# Patient Record
Sex: Male | Born: 1970 | Race: White | Hispanic: No | Marital: Married | Smoking: Never smoker
Health system: Southern US, Community
[De-identification: ages and names within clinical notes are randomized; demographics above are authoritative.]

## PROBLEM LIST (undated history)

## (undated) ENCOUNTER — Emergency Department (HOSPITAL_COMMUNITY): Admission: EM | Payer: BC Managed Care – PPO | Source: Home / Self Care

## (undated) DIAGNOSIS — E669 Obesity, unspecified: Secondary | ICD-10-CM

## (undated) DIAGNOSIS — E785 Hyperlipidemia, unspecified: Secondary | ICD-10-CM

## (undated) HISTORY — DX: Obesity, unspecified: E66.9

## (undated) HISTORY — PX: OTHER SURGICAL HISTORY: SHX169

## (undated) HISTORY — DX: Hyperlipidemia, unspecified: E78.5

---

## 2003-02-11 ENCOUNTER — Encounter: Payer: Self-pay | Admitting: Emergency Medicine

## 2003-02-11 ENCOUNTER — Emergency Department (HOSPITAL_COMMUNITY): Admission: EM | Admit: 2003-02-11 | Discharge: 2003-02-12 | Payer: Self-pay | Admitting: Emergency Medicine

## 2007-02-01 ENCOUNTER — Emergency Department (HOSPITAL_COMMUNITY): Admission: EM | Admit: 2007-02-01 | Discharge: 2007-02-01 | Payer: Self-pay | Admitting: Emergency Medicine

## 2008-08-18 ENCOUNTER — Emergency Department (HOSPITAL_COMMUNITY): Admission: EM | Admit: 2008-08-18 | Discharge: 2008-08-18 | Payer: Self-pay | Admitting: Emergency Medicine

## 2009-10-30 ENCOUNTER — Encounter (INDEPENDENT_AMBULATORY_CARE_PROVIDER_SITE_OTHER): Payer: Self-pay | Admitting: *Deleted

## 2010-08-14 NOTE — Letter (Signed)
Summary: Unable to Reach, Consult Scheduled  [facility]  [address]   [place], [place] [address]   Phone: [phone]  Fax: [phone]    10/30/2009  GARLAN DREWES [address] [place], [place]  [address] 1970-11-01   Dear Mr. Forton,   We have been unable to reach you by phone.  Please contact our office with an updated phone number.  At the recommendation of DR HAWKINS  we have been asked to schedule you a consult with DR FIELDS for your abdomen pain.  Please call our office at [phone].     Thank you,    Diana Eves  [facility] R. Roetta Sessions, M.D.    Jonette Eva, M.D. Lorenza Burton, FNP-BC    Tana Coast, PA-C Phone: [phone]    Fax: [phone]

## 2010-10-30 LAB — BASIC METABOLIC PANEL
Calcium: 9.3 mg/dL (ref 8.4–10.5)
GFR calc Af Amer: >60 mL/min (ref >60)
GFR calc non Af Amer: >60 mL/min (ref >60)
Glucose, Bld: 151 mg/dL — ABNORMAL HIGH (ref 70–99)
Potassium: 3.7 mEq/L (ref 3.5–5.1)
Sodium: 135 mEq/L (ref 135–145)

## 2010-10-30 LAB — DIFFERENTIAL
Eosinophils Relative: 0 % (ref 0–5)
Lymphocytes Relative: 7 % — ABNORMAL LOW (ref 12–46)
Lymphs Abs: 0.8 10*3/uL (ref 0.7–4.0)
Monocytes Absolute: 0.8 10*3/uL (ref 0.1–1.0)
Monocytes Relative: 7 % (ref 3–12)
Neutro Abs: 10.3 10*3/uL — ABNORMAL HIGH (ref 1.7–7.7)

## 2010-10-30 LAB — CBC
HCT: 43.6 % (ref 39.0–52.0)
Hemoglobin: 14.8 g/dL (ref 13.0–17.0)
RBC: 4.77 MIL/uL (ref 4.22–5.81)
RDW: 13.3 % (ref 11.5–15.5)

## 2011-02-22 ENCOUNTER — Ambulatory Visit (INDEPENDENT_AMBULATORY_CARE_PROVIDER_SITE_OTHER): Payer: 59 | Admitting: Urology

## 2011-02-22 DIAGNOSIS — Z3009 Encounter for other general counseling and advice on contraception: Secondary | ICD-10-CM

## 2011-03-08 ENCOUNTER — Encounter (INDEPENDENT_AMBULATORY_CARE_PROVIDER_SITE_OTHER): Payer: 59 | Admitting: Urology

## 2011-03-08 DIAGNOSIS — Z302 Encounter for sterilization: Secondary | ICD-10-CM

## 2012-10-05 ENCOUNTER — Encounter: Payer: Self-pay | Admitting: Gastroenterology

## 2012-11-03 ENCOUNTER — Ambulatory Visit (INDEPENDENT_AMBULATORY_CARE_PROVIDER_SITE_OTHER): Payer: BC Managed Care – PPO | Admitting: Gastroenterology

## 2012-11-03 ENCOUNTER — Encounter: Payer: Self-pay | Admitting: Gastroenterology

## 2012-11-03 DIAGNOSIS — K625 Hemorrhage of anus and rectum: Secondary | ICD-10-CM

## 2012-11-03 DIAGNOSIS — R1013 Epigastric pain: Secondary | ICD-10-CM

## 2012-11-03 DIAGNOSIS — G8929 Other chronic pain: Secondary | ICD-10-CM

## 2012-11-03 MED ORDER — MOVIPREP 100 G PO SOLR: 1.0000 | Freq: Once | ORAL | Status: DC

## 2012-11-03 NOTE — Patient Instructions (Addendum)
Losing weight (your BMI is 44 which is in morbidly obese category) may help some of your GI symptoms as well as your overall health.  Consider bariatric program here in [place].  Your obesity is your number one long term health concern. You will be set up for a colonoscopy for minor rectal bleeding, You will be set up for an upper endoscopy for epigastric pain (LEC, moderate sedation). Please start taking citrucel (orange flavored) powder fiber supplement.  This may cause some bloating at first but that usually goes away. Begin with a small spoonful and work your way up to a large, heaping spoonful daily over a week.                                               We are excited to introduce MyChart, a new best-in-class service that provides you online access to important information in your electronic medical record. We want to make it easier for you to view your health information - all in one secure location - when and where you need it. We expect MyChart will enhance the quality of care and service we provide.  When you register for MyChart, you can:    View your test results.    Request appointments and receive appointment reminders via email.    Request medication renewals.    View your medical history, allergies, medications and immunizations.    Communicate with your physician's office through a password-protected site.    Conveniently print information such as your medication lists.  To find out if MyChart is right for you, please talk to a member of our clinical staff today. We will gladly answer your questions about this free health and wellness tool.  If you are age 40 or older and want a member of your family to have access to your record, you must provide written consent by completing a proxy form available at our office. Please speak to our clinical staff about guidelines regarding accounts for patients younger than age 40.  As you activate your MyChart account and need any  technical assistance, please call the MyChart technical support line at [phone]) or email your question to [email or web address]. If you email your question(s), please include your name, a return phone number and the best time to reach you.  If you have non-urgent health-related questions, you can send a message to our office through MyChart at [email or web address]. If you have a medical emergency, call 911.  Thank you for using MyChart as your new health and wellness resource!   MyChart licensed from [employer],  [id number]. Patents Pending.

## 2012-11-03 NOTE — Progress Notes (Signed)
HPI: This is a  very pleasant 42 year old man who is here with his wife today.   Previously a Emergency planning/management officer.  2 years ago was having stabbing pain.  Was put on some type of med for about 2 months (may have been PPI).  He was eating fine, no abd pains but side effect of constipation.  Empty stomach causes epigastric pain, constant dull pain.  Eating will improve (anything except dairy, ginger ale, bananas, ketchup.  But [employer] and [employer] is OK).  The pain is a bit positional as well.    Overall weight up 20-30 pounds in 1 year  Tends to be a bit constipation.  Very rare overt rectal bleeding, but + especially with spicy foods.  No NSAIDs.  No abd imaging.  Currently on no stomach meds.  Review of systems: Pertinent positive and negative review of systems were noted in the above HPI section. Complete review of systems was performed and was otherwise normal.    Past Medical History  Diagnosis Date  . Hyperlipidemia   . Obesity     Past Surgical History  Procedure Laterality Date  . Arm surgery Right     No current outpatient prescriptions on file.   No current facility-administered medications for this visit.    Allergies as of 11/03/2012 - Review Complete 11/03/2012  Allergen Reaction Noted  . Penicillins  11/03/2012    Family History  Problem Relation Age of Onset  . Diabetes Mother   . Heart disease Father     History   Social History  . Marital Status: Married    Spouse Name: N/A    Number of Children: N/A  . Years of Education: N/A   Occupational History  . Not on file.   Social History Main Topics  . Smoking status: Never Smoker   . Smokeless tobacco: Never Used  . Alcohol Use: Yes  . Drug Use: No  . Sexually Active: Not on file   Other Topics Concern  . Not on file   Social History Narrative  . No narrative on file       Physical Exam: BP 130/80  Pulse 72  Ht 6' (1.829 m)  Wt 323 lb (146.512 kg)  BMI 43.8  kg/m2 Constitutional: generally well-appearing Psychiatric: alert and oriented x3 Eyes: extraocular movements intact Mouth: oral pharynx moist, no lesions Neck: supple no lymphadenopathy Cardiovascular: heart regular rate and rhythm Lungs: clear to auscultation bilaterally Abdomen: soft, nontender, nondistended, no obvious ascites, no peritoneal signs, normal bowel sounds Extremities: no lower extremity edema bilaterally Skin: no lesions on visible extremities    Assessment and plan: 42 y.o. male with  intermittent abdominal pains, chronic constipation, intermittent rectal bleeding, morbid obesity  We will proceed with EGD and colonoscopy for rectal bleeding and epigastric discomfort. I see no reason for any further blood tests or imaging studies prior to that. We actually spent most of her time talking about his morbid obesity, BMI 44. His wife had artery pulled some literature on the bariatric program here in [place]. He has struggled with his weight his entire life, recently putting on 30 pounds in the past year even. He understands this is his #1 overall health problem.

## 2012-12-01 ENCOUNTER — Ambulatory Visit (AMBULATORY_SURGERY_CENTER): Payer: BC Managed Care – PPO | Admitting: Gastroenterology

## 2012-12-01 ENCOUNTER — Encounter: Payer: Self-pay | Admitting: Gastroenterology

## 2012-12-01 VITALS — BP 143/76 | HR 92 | Temp 98.2°F | Resp 18 | Ht 72.0 in | Wt 323.0 lb

## 2012-12-01 DIAGNOSIS — K2990 Gastroduodenitis, unspecified, without bleeding: Secondary | ICD-10-CM

## 2012-12-01 DIAGNOSIS — G8929 Other chronic pain: Secondary | ICD-10-CM

## 2012-12-01 DIAGNOSIS — R1013 Epigastric pain: Secondary | ICD-10-CM

## 2012-12-01 DIAGNOSIS — K2960 Other gastritis without bleeding: Secondary | ICD-10-CM

## 2012-12-01 DIAGNOSIS — A048 Other specified bacterial intestinal infections: Secondary | ICD-10-CM

## 2012-12-01 DIAGNOSIS — K625 Hemorrhage of anus and rectum: Secondary | ICD-10-CM

## 2012-12-01 MED ORDER — SODIUM CHLORIDE 0.9 % IV SOLN: 500.0000 mL | INTRAVENOUS | Status: DC

## 2012-12-01 NOTE — Patient Instructions (Addendum)
Discharge instructions given with verbal understanding. Biopsies taken. Resume previous medications. YOU HAD AN ENDOSCOPIC PROCEDURE TODAY AT THE [facility]: Refer to the procedure report that was given to you for any specific questions about what was found during the examination.  If the procedure report does not answer your questions, please call your gastroenterologist to clarify.  If you requested that your care partner not be given the details of your procedure findings, then the procedure report has been included in a sealed envelope for you to review at your convenience later.  YOU SHOULD EXPECT: Some feelings of bloating in the abdomen. Passage of more gas than usual.  Walking can help get rid of the air that was put into your GI tract during the procedure and reduce the bloating. If you had a lower endoscopy (such as a colonoscopy or flexible sigmoidoscopy) you may notice spotting of blood in your stool or on the toilet paper. If you underwent a bowel prep for your procedure, then you may not have a normal bowel movement for a few days.  DIET: Your first meal following the procedure should be a light meal and then it is ok to progress to your normal diet.  A half-sandwich or bowl of soup is an example of a good first meal.  Heavy or fried foods are harder to digest and may make you feel nauseous or bloated.  Likewise meals heavy in dairy and vegetables can cause extra gas to form and this can also increase the bloating.  Drink plenty of fluids but you should avoid alcoholic beverages for 24 hours.  ACTIVITY: Your care partner should take you home directly after the procedure.  You should plan to take it easy, moving slowly for the rest of the day.  You can resume normal activity the day after the procedure however you should NOT DRIVE or use heavy machinery for 24 hours (because of the sedation medicines used during the test).    SYMPTOMS TO REPORT IMMEDIATELY: A gastroenterologist  can be reached at any hour.  During normal business hours, 8:30 AM to 5:00 PM Monday through Friday, call [phone].  After hours and on weekends, please call the GI answering service at [phone] who will take a message and have the physician on call contact you.   Following lower endoscopy (colonoscopy or flexible sigmoidoscopy):  Excessive amounts of blood in the stool  Significant tenderness or worsening of abdominal pains  Swelling of the abdomen that is new, acute  Fever of 100F or higher  Following upper endoscopy (EGD)  Vomiting of blood or coffee ground material  New chest pain or pain under the shoulder blades  Painful or persistently difficult swallowing  New shortness of breath  Fever of 100F or higher  Black, tarry-looking stools  FOLLOW UP: If any biopsies were taken you will be contacted by phone or by letter within the next 1-3 weeks.  Call your gastroenterologist if you have not heard about the biopsies in 3 weeks.  Our staff will call the home number listed on your records the next business day following your procedure to check on you and address any questions or concerns that you may have at that time regarding the information given to you following your procedure. This is a courtesy call and so if there is no answer at the home number and we have not heard from you through the emergency physician on call, we will assume that you have returned to your   regular daily activities without incident.  SIGNATURES/CONFIDENTIALITY: You and/or your care partner have signed paperwork which will be entered into your electronic medical record.  These signatures attest to the fact that that the information above on your After Visit Summary has been reviewed and is understood.  Full responsibility of the confidentiality of this discharge information lies with you and/or your care-partner. 

## 2012-12-01 NOTE — Op Note (Signed)
[facility] [address] [place], [address]   ENDOSCOPY PROCEDURE REPORT  PATIENT: Aaron Ford, Aaron Ford  MR#: [id number] BIRTHDATE: 10-28-1970 , 42  yrs. old GENDER: Male ENDOSCOPIST: Rachael Fee, MD PROCEDURE DATE:  12/01/2012 PROCEDURE:  EGD w/ biopsy ASA CLASS:     Class III INDICATIONS:  epigastric discomfort, better with eating. MEDICATIONS: There was residual sedation effect present from prior procedure, Fentanyl 25 mcg IV, Versed 1mg  IV, and These medications were titrated to patient response per physician's verbal order TOPICAL ANESTHETIC: Cetacaine Spray  DESCRIPTION OF PROCEDURE: After the risks benefits and alternatives of the procedure were thoroughly explained, informed consent was obtained.  The Pentax Gastroscope [id number] endoscope was introduced through the mouth and advanced to the second portion of the duodenum without limitations.  The instrument was slowly withdrawn as the mucosa was fully examined.   There was moderate pangastritis.  This was biopsied and sent to pathology.  The examination was otherwise normal.  Retroflexed views revealed no abnormalities.     The scope was then withdrawn from the patient and the procedure completed.  COMPLICATIONS: There were no complications.  ENDOSCOPIC IMPRESSION: There was moderate pangastritis.  This was biopsied and sent to pathology. The examination was otherwise normal.  RECOMMENDATIONS: If biopsies show H.  pylori you will be started on appropriate antibiotics.    eSigned:  Rachael Fee, MD 12/01/2012 3:17 PM   CC: Kari Baars, MD

## 2012-12-01 NOTE — Progress Notes (Signed)
Patient did not experience any of the following events: a burn prior to discharge; a fall within the facility; wrong site/side/patient/procedure/implant event; or a hospital transfer or hospital admission upon discharge from the facility. (G8907) Patient did not have preoperative order for IV antibiotic SSI prophylaxis. (G8918)  

## 2012-12-01 NOTE — Op Note (Signed)
[facility] [address] [place], [address]   COLONOSCOPY PROCEDURE REPORT  PATIENT: Aaron Ford, Aaron Ford  MR#: [id number] BIRTHDATE: [birth year] , 42  yrs. old GENDER: Male ENDOSCOPIST: Rachael Fee, MD REFERRED QI:ONGEXB Hawkins, M.D. PROCEDURE DATE:  12/01/2012 PROCEDURE:   Colonoscopy, diagnostic  (incomplete examination) ASA CLASS:   Class III INDICATIONS:minor rectal bleeding. MEDICATIONS: Fentanyl 75 mcg IV, Versed 9 mg IV, and These medications were titrated to patient response per physician's verbal order  DESCRIPTION OF PROCEDURE:   After the risks benefits and alternatives of the procedure were thoroughly explained, informed consent was obtained.  A digital rectal exam revealed no abnormalities of the rectum.   The [id number]  endoscope was introduced through the anus and advanced to the ascending colon. No adverse events experienced.   Limited by extreme patient discomfort and his morbid obesity.   The quality of the prep was good.  The instrument was then slowly withdrawn as the colon was fully examined.    COLON FINDINGS: The colon was normal to the ascending segment.  I could see the distal aspect of the IC valve but not into the base of the cecum.  Retroflexed views revealed no abnormalities. The time to cecum=minutes 0 seconds.  Withdrawal time=minutes 0 seconds.  The scope was withdrawn and the procedure completed. COMPLICATIONS: There were no complications.  ENDOSCOPIC IMPRESSION: The colon was normal to the ascending segment.  I could see the distal aspect of the IC valve but not into the base of the cecum. Your recent minor rectal bleeding was likely from resolved hemorrhoids.  RECOMMENDATIONS: Continue once daily fiber supplement.   eSigned:  Rachael Fee, MD 12/01/2012 3:08 PM

## 2012-12-02 ENCOUNTER — Telehealth: Payer: Self-pay

## 2012-12-02 NOTE — Telephone Encounter (Signed)
Left message on answering machine. 

## 2012-12-14 ENCOUNTER — Telehealth: Payer: Self-pay

## 2012-12-14 NOTE — Telephone Encounter (Signed)
Please call the patient. Biopsies from stomach are positive for H. Pylori. Please call in amoxicillin 1 gram po twice daily for 14 days, clarithromycin 500 mg po twice daily for 14 days (no refills). He also needs to be on twice daily OTC omeprazole for 1 months, needs rov with me in 6 weeks to discuss his response to treatment.   Pt is allergic to amoxicillin what else can I send for H pylori?

## 2012-12-15 MED ORDER — BIS SUBCIT-METRONID-TETRACYC 140-125-125 MG PO CAPS: 3.0000 | ORAL_CAPSULE | Freq: Three times a day (TID) | ORAL | Status: DC

## 2012-12-15 NOTE — Telephone Encounter (Signed)
pylera has been sent to the pharmacy, pt will cal back to schedule ROV

## 2012-12-15 NOTE — Telephone Encounter (Signed)
Thanks,  Please call him in course of pylera.

## 2013-05-24 ENCOUNTER — Emergency Department (HOSPITAL_COMMUNITY)
Admission: EM | Admit: 2013-05-24 | Discharge: 2013-05-24 | Disposition: A | Discharge status: Home / Self Care | Payer: BC Managed Care – PPO | Attending: Emergency Medicine | Admitting: Emergency Medicine

## 2013-05-24 ENCOUNTER — Encounter (HOSPITAL_COMMUNITY): Payer: Self-pay | Admitting: Emergency Medicine

## 2013-05-24 ENCOUNTER — Emergency Department (HOSPITAL_COMMUNITY): Payer: BC Managed Care – PPO

## 2013-05-24 DIAGNOSIS — Z880 Allergy status to penicillin: Secondary | ICD-10-CM | POA: Insufficient documentation

## 2013-05-24 DIAGNOSIS — Z79899 Other long term (current) drug therapy: Secondary | ICD-10-CM | POA: Insufficient documentation

## 2013-05-24 DIAGNOSIS — L259 Unspecified contact dermatitis, unspecified cause: Secondary | ICD-10-CM | POA: Insufficient documentation

## 2013-05-24 DIAGNOSIS — E669 Obesity, unspecified: Secondary | ICD-10-CM | POA: Insufficient documentation

## 2013-05-24 DIAGNOSIS — J3489 Other specified disorders of nose and nasal sinuses: Secondary | ICD-10-CM | POA: Insufficient documentation

## 2013-05-24 DIAGNOSIS — L309 Dermatitis, unspecified: Secondary | ICD-10-CM

## 2013-05-24 DIAGNOSIS — R05 Cough: Secondary | ICD-10-CM | POA: Insufficient documentation

## 2013-05-24 DIAGNOSIS — IMO0002 Reserved for concepts with insufficient information to code with codable children: Secondary | ICD-10-CM | POA: Insufficient documentation

## 2013-05-24 DIAGNOSIS — R059 Cough, unspecified: Secondary | ICD-10-CM | POA: Insufficient documentation

## 2013-05-24 MED ORDER — HYDROCOD POLST-CHLORPHEN POLST 10-8 MG/5ML PO LQCR: 5.0000 mL | Freq: Two times a day (BID) | ORAL | Status: DC | PRN

## 2013-05-24 MED ORDER — AZITHROMYCIN 250 MG PO TABS: ORAL_TABLET | ORAL | Status: DC

## 2013-05-24 MED ORDER — DEXAMETHASONE SODIUM PHOSPHATE 4 MG/ML IJ SOLN
10.0000 mg | Freq: Once | INTRAMUSCULAR | Status: AC
  Administered 2013-05-24: 10 mg via INTRAMUSCULAR
  Filled 2013-05-24: qty 3

## 2013-05-24 MED ORDER — TRIAMCINOLONE ACETONIDE 0.1 % EX CREA: 1.0000 "application " | TOPICAL_CREAM | Freq: Three times a day (TID) | CUTANEOUS | Status: DC

## 2013-05-24 NOTE — Discharge Instructions (Signed)
Fever, Adult °A fever is a temperature of 100.4° F (38° C) or above.  °HOME CARE °· Take fever medicine as told by your doctor. Do not  take aspirin for fever if you are younger than 40 years of age. °· If you are given antibiotic medicine, take it as told. Finish the medicine even if you start to feel better. °· Rest. °· Drink enough fluids to keep your pee (urine) clear or pale yellow. Do not drink alcohol. °· Take a bath or shower with room temperature water. Do not use ice water or alcohol sponge baths. °· Wear lightweight, loose clothes. °GET HELP RIGHT AWAY IF:  °· You are short of breath or have trouble breathing. °· You are very weak. °· You are dizzy or you pass out (faint). °· You are very thirsty or are making little or no urine. °· You have new pain. °· You throw up (vomit) or have watery poop (diarrhea). °· You keep throwing up or having watery poop for more than 1 to 2 days. °· You have a stiff neck or light bothers your eyes. °· You have a skin rash. °· You have a fever or problems (symptoms) that last for more than 2 to 3 days. °· You have a fever and your problems quickly get worse. °· You keep throwing up the fluids you drink. °· You do not feel better after 3 days. °· You have new problems. °MAKE SURE YOU:  °· Understand these instructions. °· Will watch your condition. °· Will get help right away if you are not doing well or get worse. °Document Released: 04/09/2008 Document Revised: 09/23/2011 Document Reviewed: 05/02/2011 °ExitCare® Patient Information ©2014 [employer]. ° °

## 2013-05-24 NOTE — ED Notes (Signed)
Fever, cough, weak

## 2013-05-26 NOTE — ED Provider Notes (Signed)
CSN: [id number]     Arrival date & time 05/24/13  1419 History   First MD Initiated Contact with Patient 05/24/13 1639     Chief Complaint  Patient presents with  . Fever   (Consider location/radiation/quality/duration/timing/severity/associated sxs/prior Treatment) Patient is a 42 y.o. male presenting with fever. The history is provided by the patient.  Fever Severity:  Unable to specify Onset quality:  Gradual Duration:  10 days Timing:  Intermittent Progression:  Waxing and waning Chronicity:  New Relieved by:  Nothing Worsened by:  Nothing tried Ineffective treatments:  Acetaminophen Associated symptoms: chills, congestion, cough and rash   Associated symptoms: no dysuria, no ear pain, no headaches, no myalgias, no nausea, no rhinorrhea, no sore throat and no vomiting   Associated symptoms comment:  Generalized weakness and fatigue Congestion:    Location:  Chest   Interferes with sleep: no     Interferes with eating/drinking: no   Cough:    Cough characteristics:  Productive   Sputum characteristics:  White   Severity:  Mild   Onset quality:  Gradual   Timing:  Intermittent   Progression:  Waxing and waning   Chronicity:  New Rash:    Location:  Chest and neck   Quality: itchiness and redness     Severity:  Mild   Onset quality:  Gradual   Duration:  2 days   Timing:  Constant   Progression:  Unchanged Risk factors: no recent travel and no sick contacts     Past Medical History  Diagnosis Date  . Hyperlipidemia   . Obesity    Past Surgical History  Procedure Laterality Date  . Arm surgery Right    Family History  Problem Relation Age of Onset  . Diabetes Mother   . Heart disease Father    History  Substance Use Topics  . Smoking status: Never Smoker   . Smokeless tobacco: Never Used  . Alcohol Use: Yes    Review of Systems  Constitutional: Positive for fever and chills.  HENT: Positive for congestion. Negative for ear pain, rhinorrhea and sore  throat.   Respiratory: Positive for cough.   Gastrointestinal: Negative for nausea, vomiting and abdominal pain.  Genitourinary: Negative for dysuria.  Musculoskeletal: Negative for myalgias.  Skin: Positive for rash. Negative for color change.  Neurological: Negative for dizziness and headaches.  Hematological: Negative for adenopathy.  All other systems reviewed and are negative.    Allergies  Penicillins  Home Medications   Current Outpatient Rx  Name  Route  Sig  Dispense  Refill  . azithromycin (ZITHROMAX Z-PAK) 250 MG tablet      Take two tablets on day one, then one tab qd days 2-5   6 tablet   0   . bismuth-metronidazole-tetracycline (PYLERA) 140-125-125 MG per capsule   Oral   Take 3 capsules by mouth 4 (four) times daily -  before meals and at bedtime.   120 capsule   0   . chlorpheniramine-HYDROcodone (TUSSIONEX PENNKINETIC ER) 10-8 MG/5ML LQCR   Oral   Take 5 mLs by mouth every 12 (twelve) hours as needed for cough.   60 mL   0   . triamcinolone cream (KENALOG) 0.1 %   Topical   Apply 1 application topically 3 (three) times daily.   30 g   0    BP 134/91  Pulse 101  Temp(Src) 98.8 F (37.1 C) (Oral)  Resp 20  Ht 6' (1.829 m)  Wt 315 lb (  142.883 kg)  BMI 42.71 kg/m2  SpO2 98% Physical Exam  Nursing note and vitals reviewed. Constitutional: He is oriented to person, place, and time. He appears well-developed and well-nourished. No distress.  HENT:  Head: Normocephalic and atraumatic.  Right Ear: Tympanic membrane and ear canal normal.  Left Ear: Tympanic membrane and ear canal normal.  Mouth/Throat: Uvula is midline, oropharynx is clear and moist and mucous membranes are normal. No oropharyngeal exudate.  Eyes: EOM are normal. Pupils are equal, round, and reactive to light.  Neck: Normal range of motion, full passive range of motion without pain and phonation normal. Neck supple.  Cardiovascular: Normal rate, regular rhythm, normal heart sounds  and intact distal pulses.   No murmur heard. Pulmonary/Chest: Effort normal. No stridor. No respiratory distress. He has no rales. He exhibits no tenderness.  Coarse lungs sounds bilaterally.  No rales or wheezes  Abdominal: Soft. He exhibits no distension. There is no tenderness. There is no rebound and no guarding.  Musculoskeletal: He exhibits no edema.  Lymphadenopathy:    He has no cervical adenopathy.  Neurological: He is alert and oriented to person, place, and time. He exhibits normal muscle tone. Coordination normal.  Skin: Skin is warm and dry. Rash noted.  Scattered erythematous maculopapular rash to the upper chest and neck.  No pustules or vesicles    ED Course  Procedures (including critical care time) Labs Review Labs Reviewed - No data to display Imaging Review Dg Chest 2 View  05/24/2013   CLINICAL DATA:  Fever. Cough and weakness  EXAM: CHEST  2 VIEW  COMPARISON:  None.  FINDINGS: The heart size and mediastinal contours are within normal limits. Both lungs are clear. The visualized skeletal structures are unremarkable.  IMPRESSION: No active cardiopulmonary disease.   Electronically Signed   By: Signa Kell M.D.   On: 05/24/2013 16:27    EKG Interpretation   None       MDM   1. Cough   2. Dermatitis     X-ray findings discussed, VSS.  Pt is well appearing, non-toxic .  No meningeal signs.  Pt agrees to close f/u with his PMD.  Appears stable for discharge.    Brecklyn Galvis L. Trisha Mangle, PA-C 05/26/13 1221

## 2013-05-27 NOTE — ED Provider Notes (Signed)
Medical screening examination/treatment/procedure(s) were performed by non-physician practitioner and as supervising physician I was immediately available for consultation/collaboration.  EKG Interpretation   None       Ishi Danser, MD, FACEP   Meygan Kyser L Journe Hallmark, MD 05/27/13 1447 

## 2013-06-21 ENCOUNTER — Telehealth: Payer: Self-pay | Admitting: Gastroenterology

## 2013-06-21 NOTE — Telephone Encounter (Signed)
Pt is not feeling any better since finishing treatment for h pylori. Appt has been scheduled with Dr Christella Hartigan on 07/27/13.  Pt will be put on wait list.  He will call if his symptoms worsen

## 2013-06-21 NOTE — Telephone Encounter (Signed)
Left message on machine to call back  

## 2013-07-27 ENCOUNTER — Ambulatory Visit (INDEPENDENT_AMBULATORY_CARE_PROVIDER_SITE_OTHER): Payer: BC Managed Care – PPO | Admitting: Gastroenterology

## 2013-07-27 ENCOUNTER — Encounter: Payer: Self-pay | Admitting: Gastroenterology

## 2013-07-27 VITALS — BP 122/88 | HR 120 | Ht 72.0 in | Wt 311.2 lb

## 2013-07-27 DIAGNOSIS — M940 Chondrocostal junction syndrome [Tietze]: Secondary | ICD-10-CM

## 2013-07-27 NOTE — Progress Notes (Signed)
Review of pertinent gastrointestinal problems: 1. H. Pylori + gastritis (EGD biopsies 5/14), treated with pylera.  Really noticed no changes in his dyspepsia after Abx. 2. Minor rectal bleeding (incomplete colonoscopy 5/14) was normal.      HPI: This is a   very pleasant 43 year old man whom I last saw several months ago.  Has lost 12 pounds since last visit 6-8 months.  Has been on abx, steroids for URI.  While on treatment his left upper quadrant pain improved  Previously he was having intermittent LUQ stabbing pain.  NOw it is a more consistent nagging.     Past Medical History  Diagnosis Date  . Hyperlipidemia   . Obesity     Past Surgical History  Procedure Laterality Date  . Arm surgery Right     No current outpatient prescriptions on file.   No current facility-administered medications for this visit.    Allergies as of 07/27/2013 - Review Complete 07/27/2013  Allergen Reaction Noted  . Penicillins  11/03/2012    Family History  Problem Relation Age of Onset  . Diabetes Mother   . Heart disease Father     History   Social History  . Marital Status: Married    Spouse Name: N/A    Number of Children: N/A  . Years of Education: N/A   Occupational History  . Not on file.   Social History Main Topics  . Smoking status: Never Smoker   . Smokeless tobacco: Never Used  . Alcohol Use: Yes  . Drug Use: No  . Sexual Activity: Not on file   Other Topics Concern  . Not on file   Social History Narrative  . No narrative on file      Physical Exam: BP 122/88  Pulse 120  Ht 6' (1.829 m)  Wt 311 lb 3.2 oz (141.159 kg)  BMI 42.20 kg/m2 Constitutional: generally well-appearing Psychiatric: alert and oriented x3 Abdomen: soft, nontender, nondistended, no obvious ascites, no peritoneal signs, normal bowel sounds The left upper quadrant pain is a point tenderness, very superficial, is between 2 ribs    Assessment and plan: 43 y.o. male with left  upper quadrant, costochondritis type pain  I do not think his pains are GI related. May improve with NSAIDs. They also seem to improve while he was on URI treatment with antibiotics and steroids. On exam the pain is very superficial and seems to be between 2 ribs on the left side. I think this is costochondritis I refer him back to his primary care physician. In retrospect the H. pylori positive gastritis was likely incidental, unrelated to his pains.

## 2013-07-27 NOTE — Patient Instructions (Signed)
Your pains do not seem GI related. More likely is costochondritis.  You should see Dr. Juanetta GoslingHawkins for this.  Consider injection if appropriate. Periodic NSAIDs (like alleve, motrin) can help.

## 2014-09-19 IMAGING — CR DG CHEST 2V
2 series · 2 of 2 positions shown · non-contrast
Comparison: None.

CLINICAL DATA: Fever. Cough and weakness

EXAM:
CHEST  2 VIEW

[view not recorded (1 of 2)]
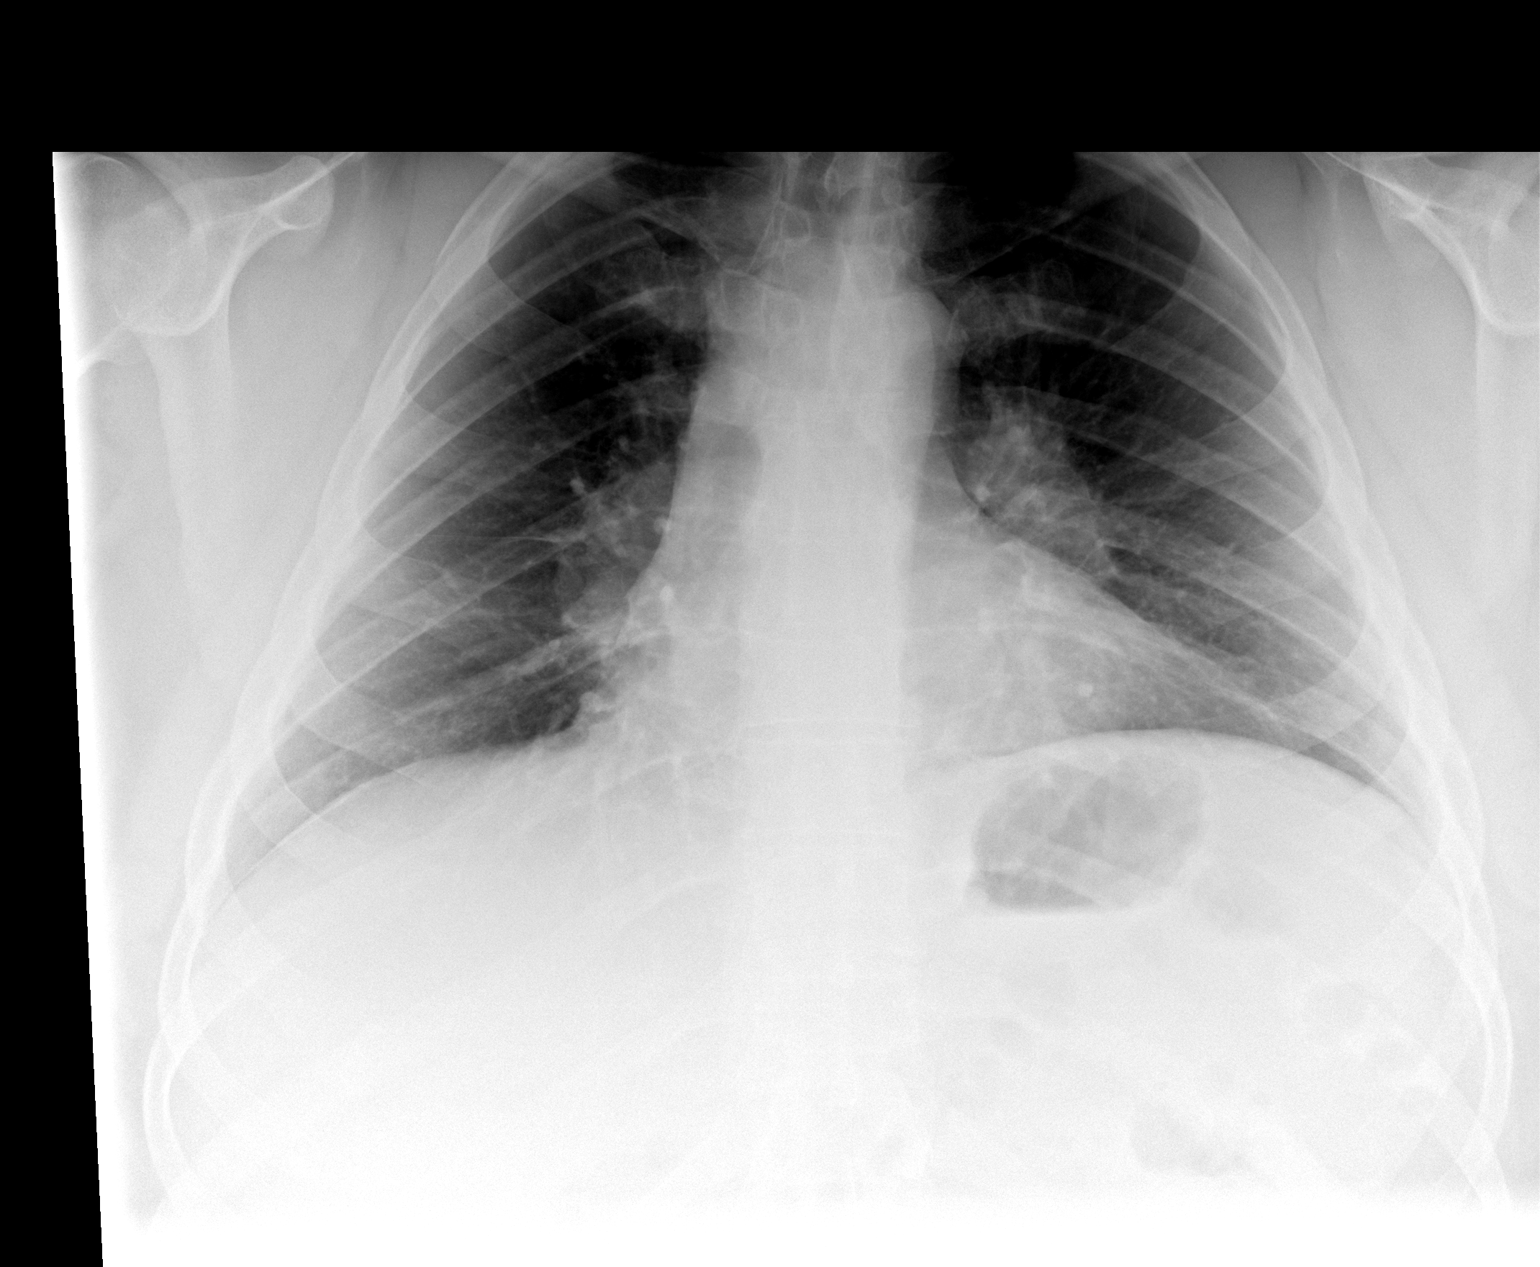

[view not recorded (2 of 2)]
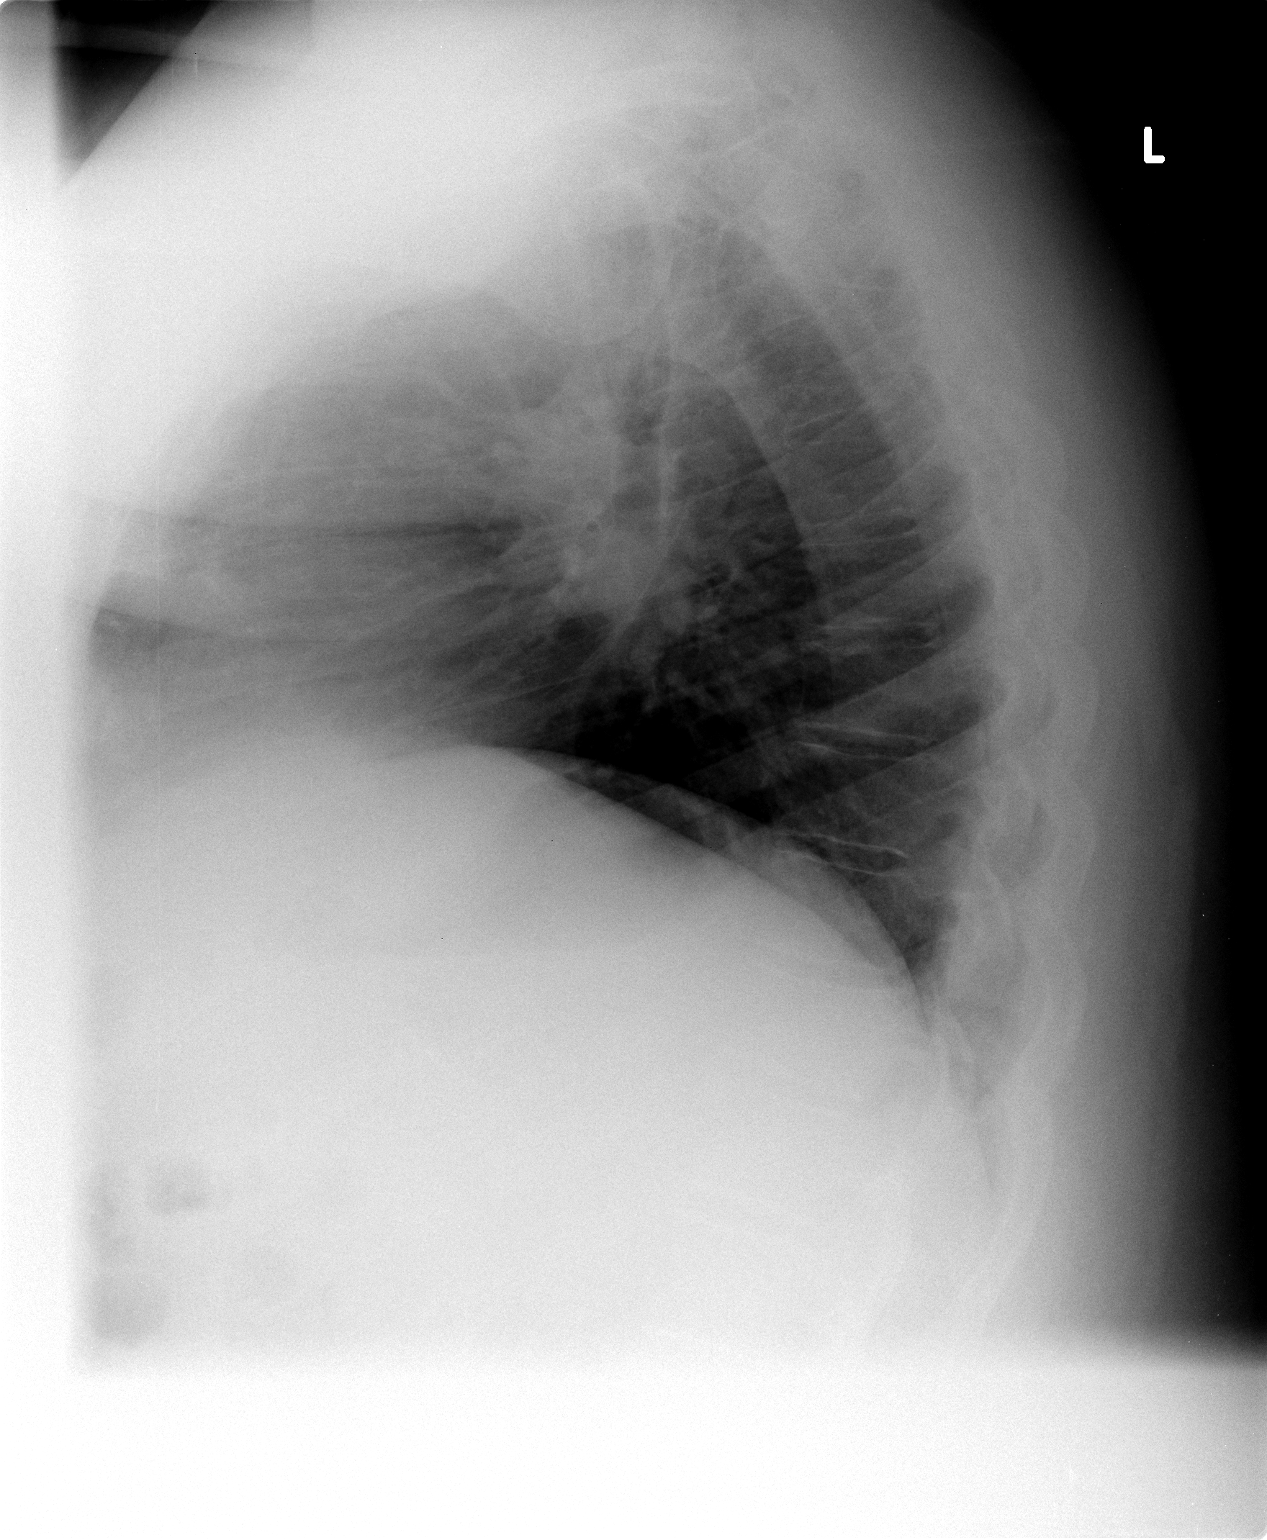

[2 of 2 positions shown; findings below may reference images not displayed]

FINDINGS: The heart size and mediastinal contours are within normal limits.
Both lungs are clear. The visualized skeletal structures are
unremarkable.
IMPRESSION: No active cardiopulmonary disease.
